# Patient Record
Sex: Male | Born: 1975 | Hispanic: Yes | Marital: Married | Smoking: Never smoker
Health system: Southern US, Community
[De-identification: ages and names within clinical notes are randomized; demographics above are authoritative.]

## PROBLEM LIST (undated history)

## (undated) DIAGNOSIS — N200 Calculus of kidney: Secondary | ICD-10-CM

## (undated) DIAGNOSIS — R300 Dysuria: Secondary | ICD-10-CM

## (undated) HISTORY — DX: Calculus of kidney: N20.0

## (undated) HISTORY — DX: Dysuria: R30.0

---

## 2010-05-21 ENCOUNTER — Emergency Department: Payer: Self-pay | Admitting: Internal Medicine

## 2015-06-21 ENCOUNTER — Encounter: Payer: Self-pay | Admitting: Emergency Medicine

## 2015-06-21 ENCOUNTER — Emergency Department
Admission: EM | Admit: 2015-06-21 | Discharge: 2015-06-22 | Disposition: A | Discharge status: Home / Self Care | Payer: BLUE CROSS/BLUE SHIELD | Attending: Emergency Medicine | Admitting: Emergency Medicine

## 2015-06-21 DIAGNOSIS — R030 Elevated blood-pressure reading, without diagnosis of hypertension: Secondary | ICD-10-CM | POA: Diagnosis not present

## 2015-06-21 DIAGNOSIS — R319 Hematuria, unspecified: Secondary | ICD-10-CM | POA: Insufficient documentation

## 2015-06-21 DIAGNOSIS — R300 Dysuria: Secondary | ICD-10-CM | POA: Diagnosis not present

## 2015-06-21 LAB — BASIC METABOLIC PANEL
ANION GAP: 4 — AB (ref 5–15)
BUN: 22 mg/dL — ABNORMAL HIGH (ref 6–20)
CHLORIDE: 105 mmol/L (ref 101–111)
CO2: 29 mmol/L (ref 22–32)
CREATININE: 0.91 mg/dL (ref 0.61–1.24)
Calcium: 9.1 mg/dL (ref 8.9–10.3)
GFR calc non Af Amer: >60 mL/min (ref >60)
Glucose, Bld: 115 mg/dL — ABNORMAL HIGH (ref 65–99)
Potassium: 4 mmol/L (ref 3.5–5.1)
Sodium: 138 mmol/L (ref 135–145)

## 2015-06-21 LAB — CBC WITH DIFFERENTIAL/PLATELET
BASOS PCT: 0 %
Basophils Absolute: 0 10*3/uL (ref 0–0.1)
EOS ABS: 0.1 10*3/uL (ref 0–0.7)
Eosinophils Relative: 2 %
HEMATOCRIT: 44.7 % (ref 40.0–52.0)
Hemoglobin: 15.3 g/dL (ref 13.0–18.0)
LYMPHS ABS: 2.3 10*3/uL (ref 1.0–3.6)
Lymphocytes Relative: 31 %
MCH: 29 pg (ref 26.0–34.0)
MCHC: 34.3 g/dL (ref 32.0–36.0)
MCV: 84.6 fL (ref 80.0–100.0)
MONOS PCT: 7 %
Monocytes Absolute: 0.5 10*3/uL (ref 0.2–1.0)
NEUTROS ABS: 4.5 10*3/uL (ref 1.4–6.5)
NEUTROS PCT: 60 %
Platelets: 196 10*3/uL (ref 150–440)
RBC: 5.28 MIL/uL (ref 4.40–5.90)
RDW: 13 % (ref 11.5–14.5)
WBC: 7.4 10*3/uL (ref 3.8–10.6)

## 2015-06-21 LAB — URINALYSIS COMPLETE WITH MICROSCOPIC (ARMC ONLY)
BILIRUBIN URINE: NEGATIVE
Bacteria, UA: NONE SEEN
GLUCOSE, UA: NEGATIVE mg/dL
Ketones, ur: NEGATIVE mg/dL
Leukocytes, UA: NEGATIVE
NITRITE: NEGATIVE
Protein, ur: NEGATIVE mg/dL
SQUAMOUS EPITHELIAL / LPF: NONE SEEN
Specific Gravity, Urine: 1.019 (ref 1.005–1.030)
pH: 6 (ref 5.0–8.0)

## 2015-06-21 MED ORDER — CIPROFLOXACIN HCL 500 MG PO TABS
500.0000 mg | ORAL_TABLET | Freq: Once | ORAL | Status: AC
  Administered 2015-06-21: 500 mg via ORAL
  Filled 2015-06-21: qty 1

## 2015-06-21 MED ORDER — CIPROFLOXACIN HCL 500 MG PO TABS: 500.0000 mg | ORAL_TABLET | Freq: Two times a day (BID) | ORAL | Status: AC

## 2015-06-21 NOTE — ED Provider Notes (Signed)
[facility] Emergency Department Provider Note  ____________________________________________  Time seen: Approximately 11:05 PM  I have reviewed the triage vital signs and the nursing notes.   HISTORY  Chief Complaint Dysuria    HPI Edward Dixon is a 40 y.o. male approximately one month of dysuria. He has a very mild pain at times when not urinating but has increasing discomfort with urination. Reports a history of kidney stones. No current back pain, fevers or chills. No abdominal pain. He reports occasional nausea though rare. No chest pain shortness of breath. No past medical history.   History reviewed. No pertinent past medical history.  There are no active problems to display for this patient.   History reviewed. No pertinent past surgical history.  Current Outpatient Rx  Name  Route  Sig  Dispense  Refill  . ciprofloxacin (CIPRO) 500 MG tablet   Oral   Take 1 tablet (500 mg total) by mouth 2 (two) times daily.   20 tablet   0     Allergies Review of patient's allergies indicates no known allergies.  Family History  Problem Relation Age of Onset  . Diabetes Father     Social History Social History  Substance Use Topics  . Smoking status: Never Smoker   . Smokeless tobacco: None  . Alcohol Use: No    Review of Systems Constitutional: No fever/chills Eyes: No visual changes. ENT: No sore throat. Cardiovascular: Denies chest pain. Respiratory: Denies shortness of breath. Gastrointestinal: No abdominal pain.  No nausea, no vomiting.  No diarrhea.  No constipation. Genitourinary: Negative for dysuria. Musculoskeletal: Negative for back pain. Skin: Negative for rash. Neurological: Negative for headaches, focal weakness or numbness. 10-point ROS otherwise negative.  ____________________________________________   PHYSICAL EXAM:  VITAL SIGNS: ED Triage Vitals  Enc Vitals Group     BP 06/21/15 2112 161/106 mmHg     Pulse  Rate 06/21/15 2112 89     Resp 06/21/15 2112 18     Temp 06/21/15 2112 97.7 F (36.5 C)     Temp Source 06/21/15 2112 Oral     SpO2 06/21/15 2112 96 %     Weight 06/21/15 2112 180 lb (81.647 kg)     Height 06/21/15 2112 5' 6"  (1.676 m)     Head Cir --      Peak Flow --      Pain Score 06/21/15 2114 4     Pain Loc --      Pain Edu? --      Excl. in [place]? --     Constitutional: Alert and oriented. Well appearing and in no acute distress. Eyes: Conjunctivae are normal. Cardiovascular: Normal rate, regular rhythm. Grossly normal heart sounds.  Good peripheral circulation. Respiratory: Normal respiratory effort.  No retractions. Lungs CTAB. Gastrointestinal: Soft and nontender. No distention. No abdominal bruits. No CVA tenderness. Musculoskeletal: Nml ROM of upper and lower extremity joints. GU: Uncircumcised male with normal penile glans. No masses noted no rash. No adenopathy or hernia noted Neurologic:  Normal speech and language. No gross focal neurologic deficits are appreciated. No gait instability. Skin:  Skin is warm, dry and intact. No rash noted. Psychiatric: Mood and affect are normal. Speech and behavior are normal.  ____________________________________________   LABS (all labs ordered are listed, but only abnormal results are displayed)  Labs Reviewed  URINALYSIS COMPLETEWITH MICROSCOPIC ([place] ONLY) - Abnormal; Notable for the following:    Color, Urine YELLOW (*)    APPearance CLEAR (*)  Hgb urine dipstick 2+ (*)    All other components within normal limits  BASIC METABOLIC PANEL - Abnormal; Notable for the following:    Glucose, Bld 115 (*)    BUN 22 (*)    Anion gap 4 (*)    All other components within normal limits  CBC WITH DIFFERENTIAL/PLATELET   ____________________________________________  EKG   ____________________________________________  RADIOLOGY   ____________________________________________   PROCEDURES  Procedure(s) performed:  None  Critical Care performed: No  ____________________________________________   INITIAL IMPRESSION / ASSESSMENT AND PLAN / ED COURSE  Pertinent labs & imaging results that were available during my care of the patient were reviewed by me and considered in my medical decision making (see chart for details).  40 year old male with history of kidney stones who presents with 1 month of dysuria. No flank pain, fevers or chills, nausea or vomiting. Stable hemoglobin and met B. Urine is positive for RBCs. We'll cover for infection with Cipro, but encouraged close follow-up with urology for further evaluation. He also has elevated blood pressure, 150/96 on discharge. Encouraged  establishing with a primary physician for further evaluation. ____________________________________________   FINAL CLINICAL IMPRESSION(S) / ED DIAGNOSES  Final diagnoses:  Hematuria  Dysuria  Elevated blood pressure (not hypertension)      Mortimer Fries, PA-C 06/21/15 [place], MD 06/22/15 1504

## 2015-06-21 NOTE — Discharge Instructions (Signed)
Hematuria - Adultos (Hematuria, Adult) La hematuria es la presencia de sangre en la orina. La causa puede ser una infeccin en la vejiga, en los riones, en la prstata, clculos renales o cncer de las vas urinarias. Normalmente las infecciones pueden tratarse con medicamentos, y los clculos renales, por lo general, se eliminarn a travs de la orina. Si ninguna de estas es la causa de su hematuria, quizs se necesiten ms estudios para Building services engineer. Es muy importante que le informe a su mdico si ve sangre en la [place], aunque el sangrado se detenga sin tratamiento o no cause dolor. La sangre que aparece en la orina y luego se detiene y vuelve a aparecer nuevamente puede ser un sntoma de una enfermedad muy grave. Adems, el dolor no es un sntoma en las etapas iniciales de muchos tipos de cncer urinarios. INSTRUCCIONES PARA EL CUIDADO EN EL HOGAR   Beba mucho lquido, entre 3 a Risk analyst. Si le han diagnosticado una infeccin, se recomienda especialmente el jugo de arndanos rojos, [address] de grandes cantidades de [place].  Evite la cafena, el t y las bebidas con gas porque suelen irritar la vejiga.  Evite el alcohol ya que puede Teacher, adult education.  Tome todos los [employer] se lo haya indicado el mdico.  Si le recetaron antibiticos, asegrese de terminarlos, incluso si comienza a sentirse mejor.  Si le han diagnosticado clculos renales, siga las instrucciones de su mdico con respecto a Ecologist la orina para TEFL teacher clculo.  Vace la vejiga con frecuencia. Evite retener la orina durante largos perodos.  Despus de defecar, las mujeres deben higienizarse desde adelante hacia atrs. Use solo un papel por vez.  Si es [employer], vace la vejiga antes y despus de Management consultant. SOLICITE ATENCIN MDICA SI:  Siente dolor en la espalda.  Tiene fiebre.  Tiene sensacin de Programme researcher, broadcasting/film/video (nuseas) o vmitos.  Los sntomas no mejoran despus  de [address]. Si la condicin empeora, visite al mdico antes de lo previsto. SOLICITE ATENCIN MDICA DE INMEDIATO SI:   Vomita con frecuencia e intensamente y no puede tolerar los medicamentos.  Siente un dolor intenso en la espalda o el abdomen incluso tomando los medicamentos.  Elimina cogulos grandes o sangre con la [place].  Se siente extremadamente dbil, se desmaya o pierde el conocimiento. ASEGRESE DE QUE:   Comprende estas instrucciones.  Controlar su afeccin.  Recibir ayuda de inmediato si no mejora o si empeora.   Esta informacin no tiene Theme park manager el consejo del mdico. Asegrese de hacerle al mdico cualquier pregunta que tenga.   Document Released: 04/26/2005 Document Revised: 05/17/2014 Elsevier Interactive Patient Education [employer]  Disuria (Dysuria) La disuria es dolor o molestia al Geographical information systems officer. El dolor o la molestia se pueden sentir en el conducto que transporta la orina fuera de la vejiga (uretra) o en el tejido que rodea los genitales. El dolor tambin se puede sentir en la zona de la ingle y en la parte inferior del abdomen y de la espalda. Quizs tenga que orinar con frecuencia o la sensacin repentina de tener que orinar (tenesmo vesical). La disuria puede afectar tanto a hombres como a mujeres, pero es ms comn en las mujeres. La causa puede deberse a muchos problemas diferentes:  Infeccin en las vas urinarias en mujeres.  Infeccin en los riones o la vejiga.  Clculos en los riones o la vejiga.  Ciertas enfermedades de transmisin sexual (ETS), como la clamidia.  Deshidratacin.  Inflamacin de la vagina.  Uso de ciertos medicamentos.  Uso de ciertos jabones o productos perfumados que provocan irritacin. INSTRUCCIONES PARA EL CUIDADO EN EL HOGAR Controle su disuria para ver si hay cambios. Las siguientes indicaciones pueden ayudar a Psychologist, educational [employer] pueda sentir:  Beba suficiente lquido para Pharmacologist la  orina clara o de color amarillo plido.  Vace la vejiga con frecuencia. Evite retener la orina durante largos perodos.  Despus de defecar, las mujeres deben limpiarse desde adelante hacia atrs, usando el papel higinico solo [place].  Vace la vejiga despus de Management consultant.  Tome los medicamentos solamente como se lo haya indicado el mdico.  Si le recetaron antibiticos, asegrese de terminarlos, incluso si comienza a sentirse mejor.  Evite la cafena, el t y el alcohol. Estos productos pueden Theatre manager vejiga y Probation officer disuria. En los hombres, el alcohol puede irritar la prstata.  Concurra a todas las visitas de control como se lo haya indicado el mdico. Esto es importante.  Si le realizaron pruebas para Landscape architect causa de la disuria, es su responsabilidad retirar los [place]. Consulte en el laboratorio o en el departamento en el que fue realizado el estudio cundo y cmo podr [employer]. Hable con el mdico si tiene [employer]. SOLICITE ATENCIN MDICA SI:  Siente dolor en la espalda o a los costados del cuerpo.  Tiene fiebre.  Tiene nuseas o vmitos.  Observa sangre en la orina.  Est orinando con ms frecuencia que lo habitual. SOLICITE ATENCIN MDICA DE INMEDIATO SI:  El dolor es intenso y no se alivia con los medicamentos.  No puede retener lquido.  Usted u otra persona advierten algn cambio en su funcin mental.  Tiene una frecuencia cardaca acelerada en reposo.  Tiene temblores o escalofros.  Se siente muy dbil.   Esta informacin no tiene Theme park manager el consejo del mdico. Asegrese de hacerle al mdico cualquier pregunta que tenga.   Document Released: 05/16/2007 Document Revised: 05/17/2014 Elsevier Interactive Patient Education 2016 [employer].    Hipertensin (Hypertension) El trmino hipertensin es otra forma de denominar a la presin arterial elevada. La presin  arterial elevada fuerza al corazn a trabajar ms para bombear la sangre. Una lectura de la presin arterial consta de dos nmeros: uno ms alto sobre uno ms bajo (por ejemplo, 110/72). CUIDADOS EN EL HOGAR   Haga que el mdico le tome nuevamente la presin arterial.  Tome los medicamentos solamente como se lo haya indicado el mdico. Siga cuidadosamente las indicaciones. Los medicamentos pierden eficacia si omite dosis. El hecho de omitir las dosis tambin [place] el riesgo de otros problemas.  No fume.  Contrlese la presin arterial en su casa como se lo haya indicado el mdico. SOLICITE AYUDA SI:  Piensa que tiene una reaccin a los medicamentos que est tomando.  Tiene mareos o dolores de cabeza reiterados.  Se le inflaman (hinchan) los tobillos.  Tiene problemas de visin. SOLICITE AYUDA DE INMEDIATO SI:   Tiene un dolor de cabeza muy intenso y est confundido.  Se siente dbil, aturdido o se desmaya.  Tiene dolor en el pecho o el estmago (abdominal).  Tiene vmitos.  No puede respirar [employer]. ASEGRESE DE QUE:   Comprende estas instrucciones.  Controlar su afeccin.  Recibir ayuda de inmediato si no mejora o si empeora.   Esta informacin no tiene Theme park manager el consejo del mdico. Asegrese de hacerle al mdico  cualquier pregunta que tenga.   Document Released: 10/14/2009 Document Revised: 05/01/2013 Elsevier Interactive Patient Education [employer]   You may have an infection causing your symptoms. Take the antibiotic as directed. I encourage you to follow-up with the urologist for further evaluation. You also should see a primary physician and have your blood pressure monitored.

## 2015-06-21 NOTE — ED Notes (Signed)
Pt. States dysuria for the past 3 months.  Pt. States worse in the past couple days.

## 2015-06-21 NOTE — ED Notes (Signed)
PT VERBALIZES UNDERSTANDING OF DISCHARGE INSTRUCTIONS   

## 2015-06-26 ENCOUNTER — Other Ambulatory Visit: Payer: Self-pay | Admitting: *Deleted

## 2015-06-26 ENCOUNTER — Encounter: Payer: Self-pay | Admitting: *Deleted

## 2015-06-26 ENCOUNTER — Ambulatory Visit (INDEPENDENT_AMBULATORY_CARE_PROVIDER_SITE_OTHER): Payer: BLUE CROSS/BLUE SHIELD | Admitting: Obstetrics and Gynecology

## 2015-06-26 VITALS — BP 150/80 | HR 75 | Resp 16 | Ht 66.0 in | Wt 184.5 lb

## 2015-06-26 DIAGNOSIS — R109 Unspecified abdominal pain: Secondary | ICD-10-CM

## 2015-06-26 DIAGNOSIS — R310 Gross hematuria: Secondary | ICD-10-CM

## 2015-06-26 LAB — MICROSCOPIC EXAMINATION

## 2015-06-26 LAB — URINALYSIS, COMPLETE
Bilirubin, UA: NEGATIVE
Glucose, UA: NEGATIVE
Ketones, UA: NEGATIVE
Leukocytes, UA: NEGATIVE
NITRITE UA: NEGATIVE
PH UA: 6.5 (ref 5.0–7.5)
Specific Gravity, UA: 1.015 (ref 1.005–1.030)
UUROB: 0.2 mg/dL (ref 0.2–1.0)

## 2015-06-26 MED ORDER — CIPROFLOXACIN HCL 500 MG PO TABS: 500.0000 mg | ORAL_TABLET | Freq: Two times a day (BID) | ORAL | Status: DC

## 2015-06-26 MED ORDER — TAMSULOSIN HCL 0.4 MG PO CAPS: 0.4000 mg | ORAL_CAPSULE | Freq: Every day | ORAL | Status: DC

## 2015-06-26 NOTE — Progress Notes (Signed)
06/26/2015 11:07 AM   Edward Dixon [place] September 06, 1975 [id number]  Referring provider: No referring provider defined for this encounter.  Chief Complaint  Patient presents with  . Flank Pain  . Hematuria    HPI: Patient is a 40 yo Hispanic male with a history of kidney stones presenting today for follow up after being seen in the ED on 06/21/15 with complaints of dysuria, bilateral flank pain,  and nausea for 1 month. Single episode of gross hematuria 1 month ago.  No fevers or vomiting.  Prescribed Cipro in the ED on 06/21/15.  Never smoker.   PMH: Past Medical History  Diagnosis Date  . Kidney stone   . Dysuria     Surgical History: No past surgical history on file.  Home Medications:    Medication List       This list is accurate as of: 06/26/15 11:07 AM.  Always use your most recent med list.               ciprofloxacin 500 MG tablet  Commonly known as:  CIPRO  Take 500 mg by mouth 2 (two) times daily.        Allergies: No Known Allergies  Family History: Family History  Problem Relation Age of Onset  . Diabetes Father     Social History:  reports that he has never smoked. He does not have any smokeless tobacco history on file. He reports that he does not drink alcohol. His drug history is not on file.  ROS: UROLOGY Frequent Urination?: No Hard to postpone urination?: No Burning/pain with urination?: Yes Get up at night to urinate?: No Leakage of urine?: No Urine stream starts and stops?: No Trouble starting stream?: No Do you have to strain to urinate?: No Blood in urine?: Yes Urinary tract infection?: No Sexually transmitted disease?: No Injury to kidneys or bladder?: No Painful intercourse?: No Weak stream?: No Erection problems?: No Penile pain?: No  Gastrointestinal Nausea?: Yes Vomiting?: No Indigestion/heartburn?: Yes Diarrhea?: No Constipation?: No  Constitutional Fever: No Night sweats?: No Weight loss?: No Fatigue?:  Yes  Skin Skin rash/lesions?: No Itching?: No  Eyes Blurred vision?: Yes Double vision?: No  Ears/Nose/Throat Sore throat?: No Sinus problems?: No  Hematologic/Lymphatic Swollen glands?: No Easy bruising?: No  Cardiovascular Leg swelling?: No Chest pain?: Yes  Respiratory Cough?: No Shortness of breath?: No  Endocrine Excessive thirst?: No  Musculoskeletal Back pain?: Yes Joint pain?: No  Neurological Headaches?: No Dizziness?: Yes  Psychologic Depression?: No Anxiety?: No  Physical Exam: BP 150/80 mmHg  Pulse 75  Resp 16  Ht  (1.676 m)  Wt 184 lb 8 oz (83.689 kg)  BMI 29.79 kg/m2  Constitutional:  Alert and oriented, No acute distress. HEENT: [place] AT, moist mucus membranes.  Trachea midline, no masses. Cardiovascular: No clubbing, cyanosis, or edema. Respiratory: Normal respiratory effort, no increased work of breathing. GI: Abdomen is soft, nontender, nondistended, no abdominal masses GU: No CVA tenderness.  Uncircumcised phallus with easily retractable foreskin, testicles descended bilaterally without palpable masses or tenderness Skin: No rashes, bruises or suspicious lesions. Lymph: No cervical or inguinal adenopathy. Neurologic: Grossly intact, no focal deficits, moving all 4 extremities. Psychiatric: Normal mood and affect.  Laboratory Data:  Lab Results  Component Value Date   WBC 7.4 06/21/2015   HGB 15.3 06/21/2015   HCT 44.7 06/21/2015   MCV 84.6 06/21/2015   PLT 196 06/21/2015    Lab Results  Component Value Date   CREATININE 0.91 06/21/2015  No results found for: PSA  No results found for: TESTOSTERONE  No results found for: HGBA1C  Urinalysis    Component Value Date/Time   COLORURINE YELLOW* 06/21/2015 2121   APPEARANCEUR CLEAR* 06/21/2015 2121   LABSPEC 1.019 06/21/2015 2121   PHURINE 6.0 06/21/2015 2121   GLUCOSEU NEGATIVE 06/21/2015 2121   HGBUR 2+* 06/21/2015 2121   BILIRUBINUR NEGATIVE 06/21/2015 2121    KETONESUR NEGATIVE 06/21/2015 2121   PROTEINUR NEGATIVE 06/21/2015 2121   NITRITE NEGATIVE 06/21/2015 2121   LEUKOCYTESUR NEGATIVE 06/21/2015 2121    Pertinent Imaging:   Assessment & Plan:    1. Flank pain- intermittent. Bilateral 1 month - Urinalysis, Complete  2. Gross Hematuria- We discussed the differential diagnosis for microscopic hematuria including nephrolithiasis, renal or upper tract tumors, bladder stones, UTIs, or bladder tumors as well as undetermined etiologies. Per AUA guidelines, I did recommend complete microscopic hematuria evaluation including CTU, possible urine cytology, and office cystoscopy. -CT Urogram -cystoscopy  Return for CT Urogram results and cystoscopy.  These notes generated with voice recognition software. I apologize for typographical errors.  Earlie Lou, FNP  [facility] [address] [place], [place] [address] [phone]

## 2015-06-26 NOTE — Patient Instructions (Signed)
Tomografa computarizada (CT Scan) Una tomografa computarizada (TC) es una radiografa especializada. Emplea rayos x y [place] computadora para tomar imgenes de diferentes zonas del cuerpo. Una TC ofrece una informacin ms detallada que un examen realizado con una radiografa normal. La TC proporciona datos de los rganos internos, las estructuras de los tejidos blandos, los vasos sanguneos y [place].  El escner para la tomografa computarizada es una gran mquina que posee una apertura a travs de la cual se hace mover al paciente para tomar las imgenes.  INFORME A SU MDICO:  Cualquier alergia que tenga.  Todos los [employer], incluidos vitaminas, hierbas, gotas oftlmicas, cremas y medicamentos de venta libre.  Problemas previos que usted o los [employer] de su familia hayan tenido con el uso de anestsicos.  Enfermedades de [employer].  Cirugas previas.  Enfermedades. RIESGOS Y COMPLICACIONES  En general, se trata de un procedimiento seguro. Sin embargo, Games developer procedimiento, pueden surgir complicaciones. Las complicaciones posibles son:   Risk analyst a la sustancia de [place].  Desarrollo de cncer por exposicin excesiva a la radiacin. Este riesgo es mnimo. ANTES DEL [place] previo al [place], deje de tomar bebidas que contengan cafena. entre ellas, bebidas energizantes, t, refrescos, caf y chocolate caliente.  El da del estudio:  Alrededor de 4 horas antes del estudio, no coma ni beba nada, excepto agua, segn las instrucciones de su mdico.  No use alhajas. Deber quitarse toda, o casi toda, la ropa y deber colocarse una bata. PROCEDIMIENTO   Le pedirn que se recueste en una camilla y [employer] brazos por encima de la cabeza.  Si el estudio requiere el uso de la sustancia de [place], le insertarn una va intravenosa en el brazo. La sustancia de contraste se inyectar en la va intravenosa. Puede ser que sienta  calor o un sabor metlico en la boca.  La camilla sobre la cual estar recostado se desplazar dentro de un equipo de gran tamao que har la exploracin.  Podr ver, Pharmacist, community y hablar con la persona que [address] el equipo mientras est dentro de [place]. Siga las indicaciones de esa persona.  El equipo de tomografa computarizada se mover alrededor suyo para [employer]. No se mueva mientras est en funcionamiento. Esto ayudar a Ship broker.  Cuando se hayan tomado las mejores imgenes posibles, se apagar el equipo, La camilla se desplazar fuera de este. Luego le retirarn la va intravenosa. DESPUS DEL PROCEDIMIENTO  Consulte a su mdico cundo debe concurrir al control para Civil engineer, contracting los [employer].   Esta informacin no tiene Marine scientist el consejo del mdico. Asegrese de hacerle al mdico cualquier pregunta que tenga.   Document Released: 04/26/2005 Document Revised: 05/01/2013 Elsevier Interactive Patient Education 2016 [employer]. Hematuria - Adultos (Hematuria, Adult) La hematuria es la presencia de sangre en la orina. La causa puede ser una infeccin en la vejiga, en los riones, en la prstata, clculos renales o cncer de las vas urinarias. Normalmente las infecciones pueden tratarse con medicamentos, y los clculos renales, por lo general, se eliminarn a travs de la orina. Si ninguna de estas es la causa de su hematuria, quizs se necesiten ms estudios para Education officer, community. Es muy importante que le informe a su mdico si ve sangre en la [place], aunque el sangrado se detenga sin tratamiento o no cause dolor. La sangre que aparece en la orina y luego se detiene y vuelve a Arts administrator  nuevamente puede ser un sntoma de una enfermedad muy grave. Adems, el dolor no es un sntoma en las etapas iniciales de muchos tipos de cncer urinarios. INSTRUCCIONES PARA EL CUIDADO EN EL HOGAR   Beba mucho lquido, entre 3 a Software engineer. Si le han  diagnosticado una infeccin, se recomienda especialmente el jugo de arndanos rojos, adems de grandes cantidades de [place].  Evite la cafena, el t y las bebidas con gas porque suelen irritar la vejiga.  Evite el alcohol ya que puede Engineer, manufacturing.  Tome todos los [employer] se lo haya indicado el mdico.  Si le recetaron antibiticos, asegrese de terminarlos, incluso si comienza a sentirse mejor.  Si le han diagnosticado clculos renales, siga las instrucciones de su mdico con respecto a Astronomer la orina para Research scientist (medical) clculo.  Vace la vejiga con frecuencia. Evite retener la orina durante largos perodos.  Despus de defecar, las mujeres deben higienizarse desde adelante hacia atrs. Use solo un papel por vez.  Si es [employer], vace la vejiga antes y despus de Clinical biochemist. SOLICITE ATENCIN MDICA SI:  Siente dolor en la espalda.  Tiene fiebre.  Tiene sensacin de Higher education careers adviser (nuseas) o vmitos.  Los sntomas no mejoran despus de 3 das. Si la condicin empeora, visite al mdico antes de lo previsto. SOLICITE ATENCIN MDICA DE INMEDIATO SI:   Vomita con frecuencia e intensamente y no puede tolerar los medicamentos.  Siente un dolor intenso en la espalda o el abdomen incluso tomando los medicamentos.  Elimina cogulos grandes o sangre con la [place].  Se siente extremadamente dbil, se desmaya o pierde el conocimiento. ASEGRESE DE QUE:   Comprende estas instrucciones.  Controlar su afeccin.  Recibir ayuda de inmediato si no mejora o si empeora.   Esta informacin no tiene Marine scientist el consejo del mdico. Asegrese de hacerle al mdico cualquier pregunta que tenga.   Document Released: 04/26/2005 Document Revised: 05/17/2014 Elsevier Interactive Patient Education 2016 [place]  (Cystoscopy)  La cistoscopia es un procedimiento que se utiliza para que el mdico pueda diagnosticar y a Clinical cytogeneticist tratar  enfermedades que afectan el tracto urinario inferior. El tracto urinario inferior incluye la vejiga y el conducto a travs del cual sale la orina desde la vejiga hacia fuera (uretra). La cistoscopia se realiza con un instrumento delgado en forma de tubo (cistoscopio). El cistoscopio tiene lentes y Hali Marry en un extremo para que el mdico pueda observar el interior de la vejiga. Se introduce en la entrada de la uretra. Su mdico lo guiar a travs de Barrister's clerk vejiga. Hay dos tipos principales de citoscopa:  Cistoscopia flexible (con un cistoscopio flexible).  Cistoscopia rgida (con un cistoscopio rgido). La cistoscopia se indica en el caso de diferentes afecciones, por ejemplo:   Infecciones del tracto urinario.  Sangre en la orina (hematuria).  Prdida del control vesical (incontinencia urinaria) o vejiga hiperactiva.  Presencia de clulas anormales halladas en la [place].  Obstruccin urinaria.  Dolor al [place]. La cistoscopia tambin se puede realizar para extraer Truddie Coco de tejido para observarlo bajo un microscopio (biopsia). Tambin puede ser til para eliminar o destruir clculos en la vejiga.  INFORME A SU MDICO SOBRE:   Alergias a alimentos o medicamentos.  Medicamentos que [place], incluyendo vitaminas, hierbas, gotas oftlmicas, medicamentos de venta libre y cremas.  Uso de corticoides (por va oral o cremas).  Problemas anteriores debido a anestsicos o a medicamentos que [employer]  la sensibilidad.  Antecedentes de hemorragias o cogulos sanguneos.  Cirugas anteriores.  Otros problemas de salud, incluyendo diabetes y problemas renales.  Posibilidad de embarazo, si corresponde. PROCEDIMIENTO  Le higienizarn el rea alrededor de la abertura de la uretra. Le aplicarn un medicamento para insensibilizar la uretra (anestsico). Si van a extraer Truddie Coco de tejido o de la piedra durante el procedimiento, le administrarn un medicamento para  dormir (anestesia general).  Su mdico introducir suavemente la punta del cistoscopio en la uretra. Deslizar el cistoscopio lentamente por la uretra hasta la vejiga. Un lquido estril pasar a travs del cistoscopio hacia la vejiga. El lquido expandir y Clinical cytogeneticist vejiga. Esto le permite al mdico una mejor visin de las paredes de la vejiga. El procedimiento dura unos 15-20 minutos.  DESPUS DEL PROCEDIMIENTO  Si le aplicaron un anestsico local, podr volver a su casa tan pronto como haya finalizado. Si se utiliza anestesia general, lo llevarn a una sala de recuperacin hasta que se encuentre estable. Puede ocurrir que por un tiempo presente hemorragia y Social research officer, government al Garment/textile technologist.    Esta informacin no tiene Marine scientist el consejo del mdico. Asegrese de hacerle al mdico cualquier pregunta que tenga.   Document Released: 04/26/2005 Document Revised: 01/19/2012 Elsevier Interactive Patient Education [employer].

## 2015-06-26 NOTE — Addendum Note (Signed)
Addended by: Fernanda Drum on: 06/26/2015 02:25 PM   Modules accepted: Orders

## 2015-06-27 ENCOUNTER — Ambulatory Visit: Payer: Self-pay | Admitting: Obstetrics and Gynecology

## 2015-07-04 ENCOUNTER — Ambulatory Visit
Admission: RE | Admit: 2015-07-04 | Discharge: 2015-07-04 | Disposition: A | Discharge status: Home / Self Care | Payer: BLUE CROSS/BLUE SHIELD | Source: Ambulatory Visit | Attending: Obstetrics and Gynecology | Admitting: Obstetrics and Gynecology

## 2015-07-04 DIAGNOSIS — R310 Gross hematuria: Secondary | ICD-10-CM

## 2015-07-04 MED ORDER — IOHEXOL 350 MG/ML SOLN: 150.0000 mL | Freq: Once | INTRAVENOUS | Status: DC | PRN

## 2015-07-09 ENCOUNTER — Ambulatory Visit (INDEPENDENT_AMBULATORY_CARE_PROVIDER_SITE_OTHER): Payer: BLUE CROSS/BLUE SHIELD | Admitting: Urology

## 2015-07-09 ENCOUNTER — Other Ambulatory Visit: Payer: BLUE CROSS/BLUE SHIELD

## 2015-07-09 ENCOUNTER — Encounter: Payer: Self-pay | Admitting: Urology

## 2015-07-09 VITALS — BP 147/97 | HR 80 | Ht 66.0 in | Wt 182.3 lb

## 2015-07-09 DIAGNOSIS — K760 Fatty (change of) liver, not elsewhere classified: Secondary | ICD-10-CM | POA: Diagnosis not present

## 2015-07-09 DIAGNOSIS — N200 Calculus of kidney: Secondary | ICD-10-CM | POA: Diagnosis not present

## 2015-07-09 DIAGNOSIS — R310 Gross hematuria: Secondary | ICD-10-CM

## 2015-07-09 DIAGNOSIS — R109 Unspecified abdominal pain: Secondary | ICD-10-CM | POA: Diagnosis not present

## 2015-07-09 MED ORDER — LIDOCAINE HCL 2 % EX GEL
1.0000 "application " | Freq: Once | CUTANEOUS | Status: AC
  Administered 2015-07-09: 1 via URETHRAL

## 2015-07-09 MED ORDER — CIPROFLOXACIN HCL 500 MG PO TABS
500.0000 mg | ORAL_TABLET | Freq: Once | ORAL | Status: AC
  Administered 2015-07-09: 500 mg via ORAL

## 2015-07-09 NOTE — Progress Notes (Signed)
5:37 PM  07/09/2015   Edward Dixon 04/10/1976 [id number]  Referring provider: No referring provider defined for this encounter.  Chief Complaint  Patient presents with  . Cysto    Ctscan results    HPI: 40 yo Hispanic male with a history of kidney stones presenting today for follow up after being seen in the ED on 06/21/15 with complaints of dysuria, bilateral flank pain,  and nausea for 1 month. Single episode of gross hematuria 1 month ago.  No fevers or vomiting.  Prescribed Cipro in the ED on 06/21/15.  Never smoker.   He presents to the office today for follow-up of CT urogram as well as cystoscopy.  CT urogram shows a punctate right lower pole stone but otherwise no obvious GU pathology. Other findings were also reviewed with the patient today.  PMH: Past Medical History  Diagnosis Date  . Kidney stone   . Dysuria     Surgical History: No past surgical history on file.  Home Medications:    Medication List    Notice  As of 07/09/2015  5:37 PM   You have not been prescribed any medications.      Allergies: No Known Allergies  Family History: Family History  Problem Relation Age of Onset  . Diabetes Father     Social History:  reports that he has never smoked. He does not have any smokeless tobacco history on file. He reports that he does not drink alcohol. His drug history is not on file.   Physical Exam: BP 147/97 mmHg  Pulse 80  Ht  (1.676 m)  Wt 182 lb 4.8 oz (82.691 kg)  BMI 29.44 kg/m2  Constitutional:  Alert and oriented, No acute distress. HEENT: [place] AT, moist mucus membranes.  Trachea midline, no masses. Cardiovascular: No clubbing, cyanosis, or edema. Respiratory: Normal respiratory effort, no increased work of breathing. GI: Abdomen is soft, nontender, nondistended, no abdominal masses GU: No CVA tenderness.  Uncircumcised phallus with easily retractable foreskin Skin: No rashes, bruises or suspicious lesions. Neurologic:  Grossly intact, no focal deficits, moving all 4 extremities. Psychiatric: Normal mood and affect.  Laboratory Data:  Lab Results  Component Value Date   WBC 7.4 06/21/2015   HGB 15.3 06/21/2015   HCT 44.7 06/21/2015   MCV 84.6 06/21/2015   PLT 196 06/21/2015    Lab Results  Component Value Date   CREATININE 0.91 06/21/2015    Urinalysis Results for orders placed or performed in visit on 06/26/15  Microscopic Examination  Result Value Ref Range   WBC, UA 6-10 (A) 0 -  5 /hpf   RBC, UA 3-10 (A) 0 -  2 /hpf   Epithelial Cells (non renal) 0-10 0 - 10 /hpf   Mucus, UA Present (A) Not Estab.   Bacteria, UA Few (A) None seen/Few  Urinalysis, Complete  Result Value Ref Range   Specific Gravity, UA 1.015 1.005 - 1.030   pH, UA 6.5 5.0 - 7.5   Color, UA Yellow Yellow   Appearance Ur Clear Clear   Leukocytes, UA Negative Negative   Protein, UA 1+ (A) Negative/Trace   Glucose, UA Negative Negative   Ketones, UA Negative Negative   RBC, UA 1+ (A) Negative   Bilirubin, UA Negative Negative   Urobilinogen, Ur 0.2 0.2 - 1.0 mg/dL   Nitrite, UA Negative Negative   Microscopic Examination See below:       Pertinent Imaging: Study Result     CLINICAL DATA: One month history  of dysuria. Hematuria.  EXAM: CT ABDOMEN AND PELVIS WITHOUT AND WITH CONTRAST  TECHNIQUE: Multidetector CT imaging of the abdomen and pelvis was performed following the standard protocol before and following the bolus administration of intravenous contrast.  CONTRAST: 150 cc Omnipaque 350  COMPARISON: None.  FINDINGS: Lower chest: The lung bases are clear of acute process. No pleural effusion or pulmonary lesions. The heart is normal in size. No pericardial effusion. The distal esophagus and aorta are unremarkable.  Hepatobiliary: Mild diffuse fatty infiltration of the liver but no focal hepatic lesions or intrahepatic biliary dilatation. The gallbladder is normal. No common bile  duct dilatation.  Pancreas: No mass, inflammation or ductal dilatation.  Spleen: Normal size. No focal lesions.  Adrenals/Urinary Tract: The adrenal glands are normal.  Small bilateral lower pole renal calculi. No obstructing ureteral calculi or bladder calculi. Both kidneys demonstrate normal enhancement/perfusion. No worrisome renal lesions. Oral a small upper pole right renal cyst is noted.  The delayed images do not demonstrate any collecting system abnormalities. The ureters are normal. The bladder is normal. Mild prostate gland enlargement. The seminal vesicles are normal.  Stomach/Bowel: The stomach, duodenum, small bowel and colon are grossly normal. No inflammatory changes, mass lesions or obstructive findings. The terminal ileum is normal. The appendix is normal. Scattered sigmoid diverticulosis without findings for acute diverticulitis.  Vascular/Lymphatic: No mesenteric or retroperitoneal mass or adenopathy. Small scattered lymph nodes are noted. The aorta is normal in caliber. No atherosclerotic calcifications.  Other: No pelvic mass or adenopathy. No free pelvic fluid collections. No inguinal mass or adenopathy.  Musculoskeletal: No significant bony findings.  IMPRESSION: 1. Small bilateral lower pole renal calculi. No obstructing ureteral calculi or bladder calculi. 2. Small upper pole right renal cyst. No worrisome renal lesions or collecting system abnormalities. 3. No acute abdominal/pelvic findings, mass lesions or lymphadenopathy. 4. Mild diffuse fatty infiltration of the liver.   Electronically Signed  By: Rudie Meyer M.D.  On: 07/09/2015 16:54      Cystoscopy Procedure Note  Patient identification was confirmed, informed consent was obtained, and patient was prepped using Betadine solution.  Lidocaine jelly was administered per urethral meatus.    Preoperative abx where received prior to procedure.     Pre-Procedure: -  Inspection reveals a normal caliber ureteral meatus.  Procedure: The flexible cystoscope was introduced without difficulty - No urethral strictures/lesions are present. - short prostatic length without significant coaptation  - Elevated bladder neck (mildly) - Bilateral ureteral orifices identified - Bladder mucosa  reveals no ulcers, tumors, or lesions - No bladder stones - No trabeculation  Retroflexion shows slight intravesical protrusion of the prostate but no well circumscribed median lobe   Post-Procedure: - Patient tolerated the procedure well   Assessment & Plan:    1. Gross hematuria No significant pathology identified today on cystoscopy or CT urogram. - Urinalysis, Complete - ciprofloxacin (CIPRO) tablet 500 mg; Take 1 tablet (500 mg total) by mouth once. - lidocaine (XYLOCAINE) 2 % jelly 1 application; Place 1 application into the urethra once.  2. Bilateral flank pain Unlikely to be GU related  3. Right kidney stone Punctate right lower kidney stone, would not recommend intervention. Stone prevention techniques including adequate oral hydration reviewed with patient.  4. Fatty liver Patient made aware finding and advised to follow-up with her PCP. He is seeking a PCP to establish care.  Vanna Scotland, MD  [facility] [address], [address] [place], [place] [address] [phone]

## 2015-07-10 LAB — URINALYSIS, COMPLETE
Bilirubin, UA: NEGATIVE
GLUCOSE, UA: NEGATIVE
Ketones, UA: NEGATIVE
Leukocytes, UA: NEGATIVE
NITRITE UA: NEGATIVE
PH UA: 7 (ref 5.0–7.5)
Protein, UA: NEGATIVE
Specific Gravity, UA: 1.02 (ref 1.005–1.030)
Urobilinogen, Ur: 0.2 mg/dL (ref 0.2–1.0)

## 2015-07-10 LAB — MICROSCOPIC EXAMINATION: BACTERIA UA: NONE SEEN

## 2016-04-04 IMAGING — CT CT ABD-PEL WO/W CM
1 of 2 series · 13 of 32 positions shown, 18 images · IV contrast (omnipaque)
Comparison: None.

CLINICAL DATA: One month history of dysuria.  Hematuria.

EXAM:
CT ABDOMEN AND PELVIS WITHOUT AND WITH CONTRAST
TECHNIQUE: Multidetector CT imaging of the abdomen and pelvis was performed
following the standard protocol before and following the bolus
administration of intravenous contrast.
CONTRAST:  150 cc Omnipaque 350

[Series 2: axial pre · axial · non-contrast · 0.75mm/px · z∈[-988,-574]mm · 13 of 97 slices shown, 18 images]
[im 7/97  soft-tissue]
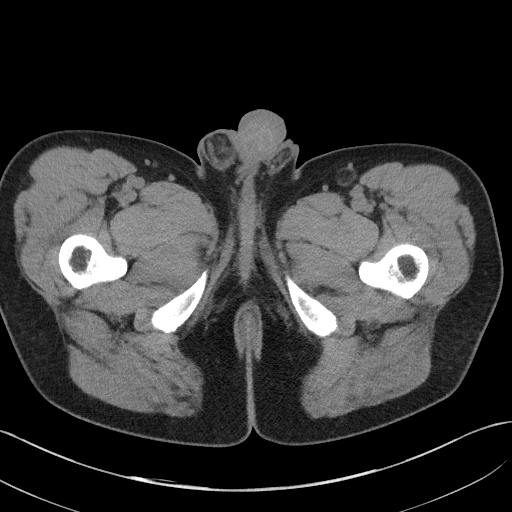
[im 7/97  bone]
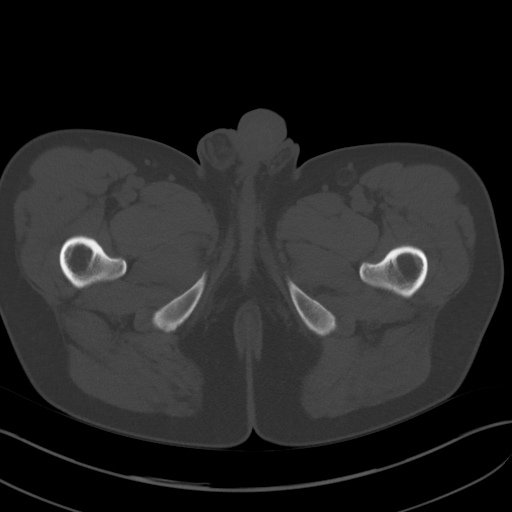
[im 13/97  soft-tissue]
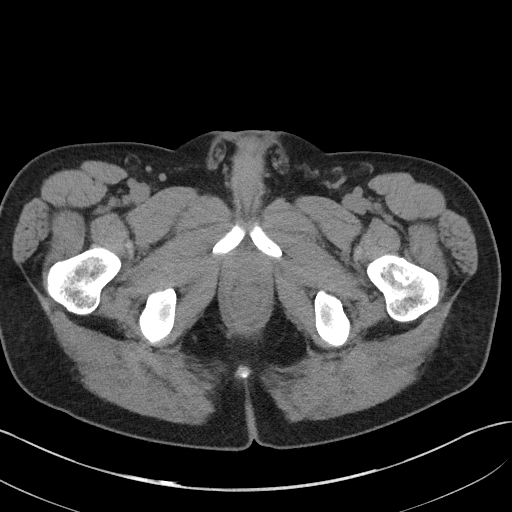
[im 20/97  soft-tissue]
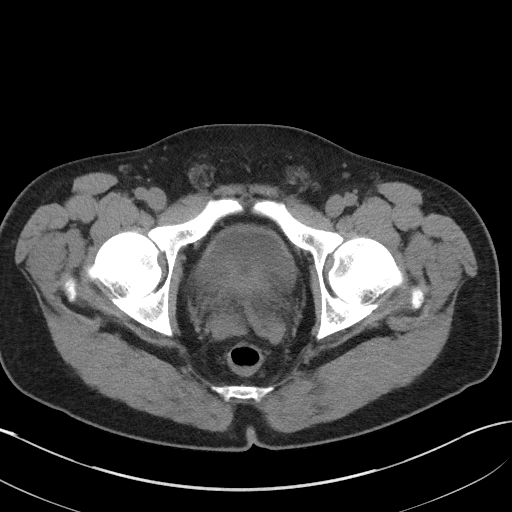
[im 33/97  soft-tissue]
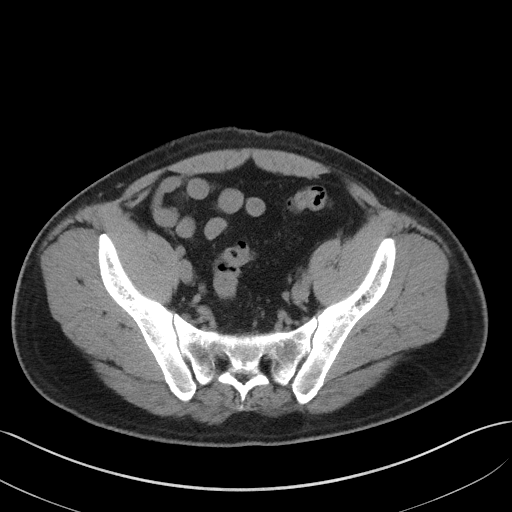
[im 39/97  soft-tissue]
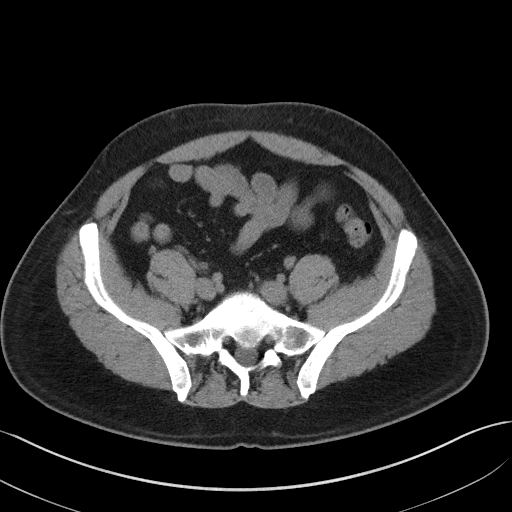
[im 45/97  soft-tissue]
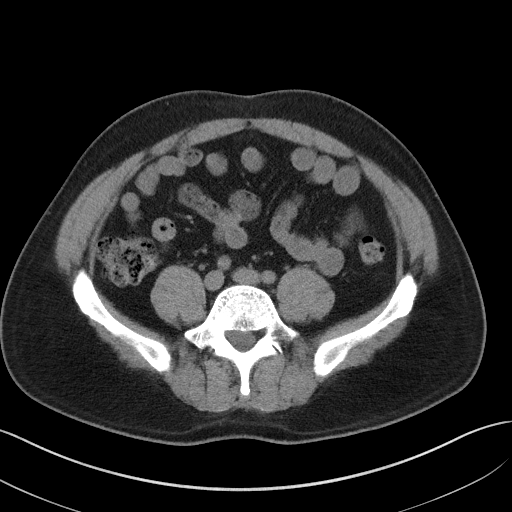
[im 52/97  soft-tissue]
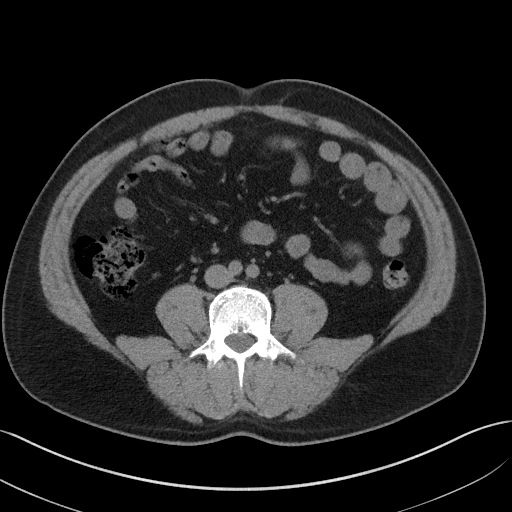
[im 58/97  soft-tissue]
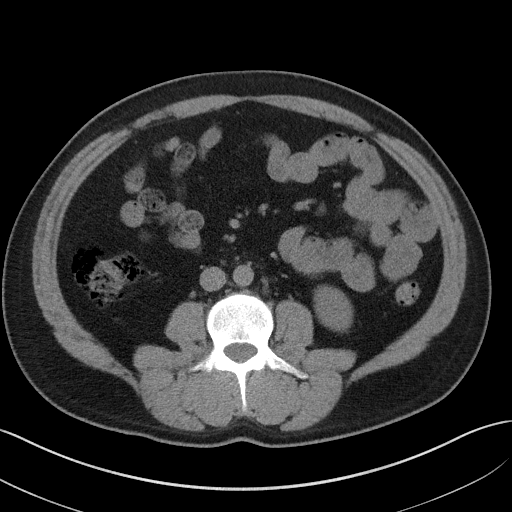
[im 65/97  soft-tissue]
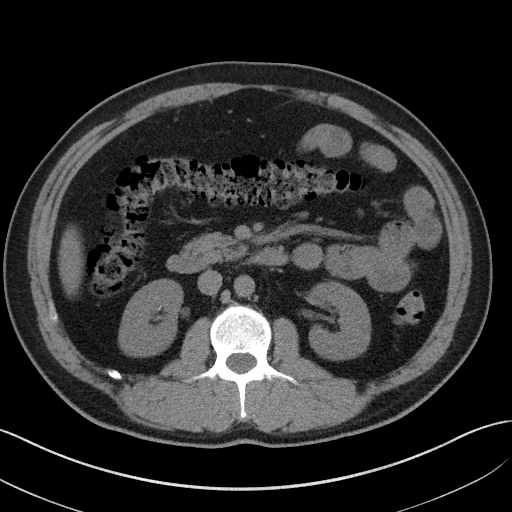
[im 65/97  bone]
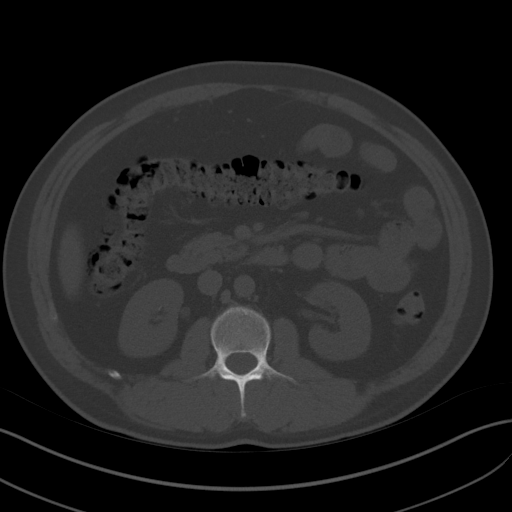
[im 71/97  lung]
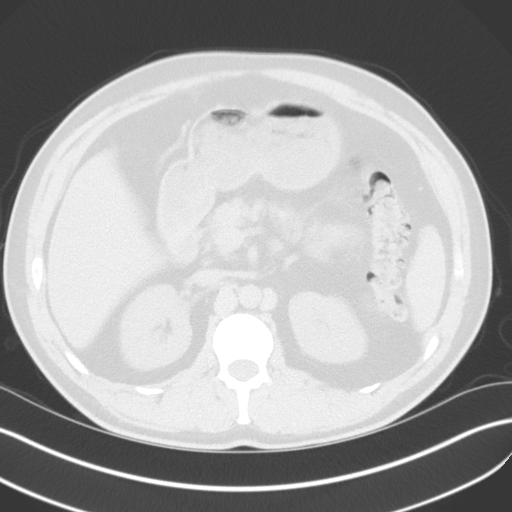
[im 77/97  soft-tissue]
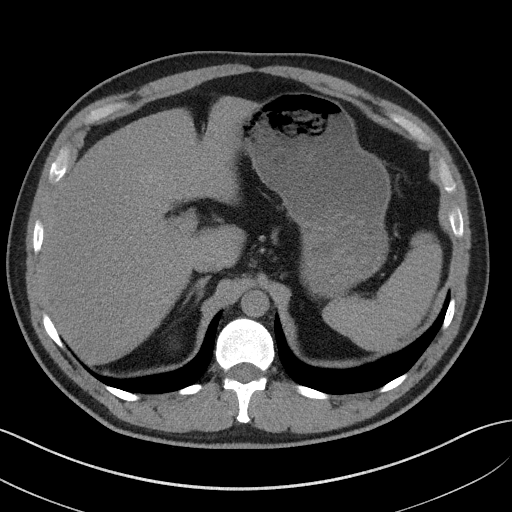
[im 77/97  lung]
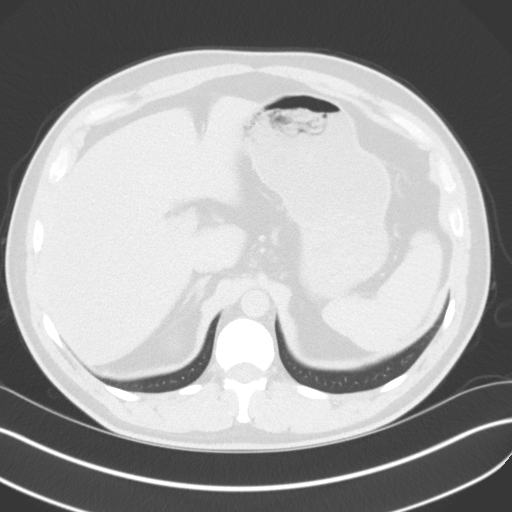
[im 84/97  soft-tissue]
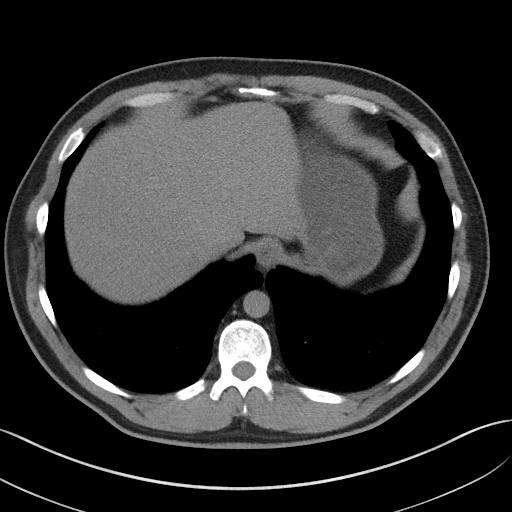
[im 84/97  lung]
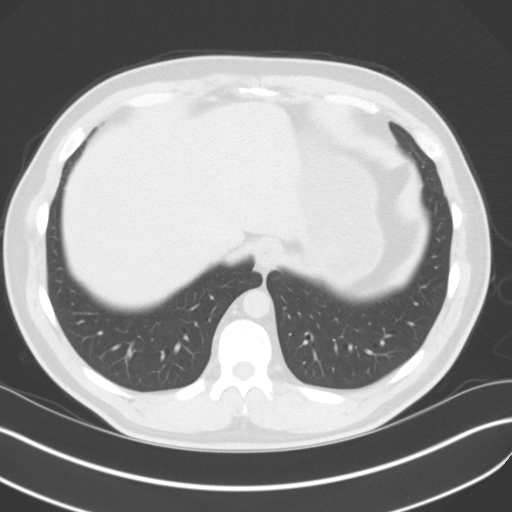
[im 90/97  soft-tissue]
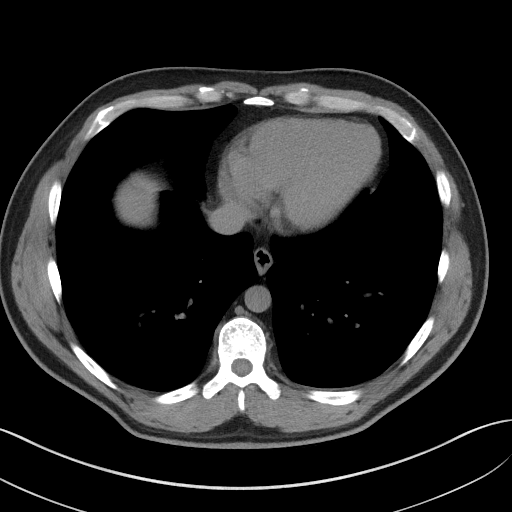
[im 90/97  lung]
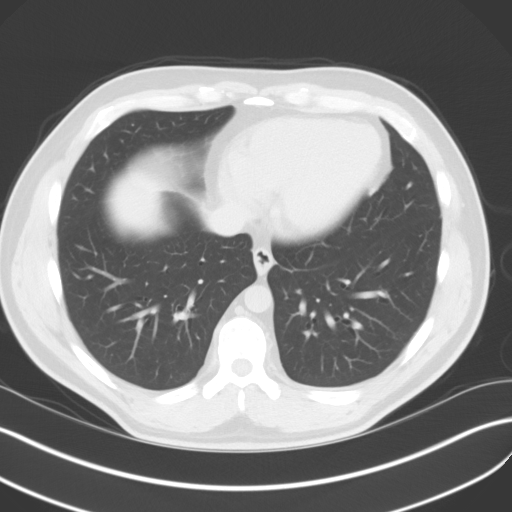

[13 of 32 positions shown; findings below may reference images not displayed]

FINDINGS: Lower chest: The lung bases are clear of acute process. No pleural
effusion or pulmonary lesions. The heart is normal in size. No
pericardial effusion. The distal esophagus and aorta are
unremarkable.

Hepatobiliary: Mild diffuse fatty infiltration of the liver but no
focal hepatic lesions or intrahepatic biliary dilatation. The
gallbladder is normal. No common bile duct dilatation.

Pancreas: No mass, inflammation or ductal dilatation.

Spleen: Normal size.  No focal lesions.

Adrenals/Urinary Tract: The adrenal glands are normal.

Small bilateral lower pole renal calculi. No obstructing ureteral
calculi or bladder calculi. Both kidneys demonstrate normal
enhancement/perfusion. No worrisome renal lesions. Oral a small
upper pole right renal cyst is noted.

The delayed images do not demonstrate any collecting system
abnormalities. The ureters are normal. The bladder is normal. Mild
prostate gland enlargement. The seminal vesicles are normal.

Stomach/Bowel: The stomach, duodenum, small bowel and colon are
grossly normal. No inflammatory changes, mass lesions or obstructive
findings. The terminal ileum is normal. The appendix is normal.
Scattered sigmoid diverticulosis without findings for acute
diverticulitis.

Vascular/Lymphatic: No mesenteric or retroperitoneal mass or
adenopathy. Small scattered lymph nodes are noted. The aorta is
normal in caliber. No atherosclerotic calcifications.

Other: No pelvic mass or adenopathy. No free pelvic fluid
collections. No inguinal mass or adenopathy.

Musculoskeletal: No significant bony findings.
IMPRESSION: 1. Small bilateral lower pole renal calculi. No obstructing ureteral
calculi or bladder calculi.
2. Small upper pole right renal cyst. No worrisome renal lesions or
collecting system abnormalities.
3. No acute abdominal/pelvic findings, mass lesions or
lymphadenopathy.
4. Mild diffuse fatty infiltration of the liver.
